# Patient Record
Sex: Male | Born: 1980 | Hispanic: Yes | Marital: Single | Smoking: Never smoker
Health system: Southern US, Community
[De-identification: ages and names within clinical notes are randomized; demographics above are authoritative.]

---

## 2019-07-14 ENCOUNTER — Emergency Department: Payer: Self-pay

## 2019-07-14 ENCOUNTER — Other Ambulatory Visit: Payer: Self-pay

## 2019-07-14 ENCOUNTER — Emergency Department
Admission: EM | Admit: 2019-07-14 | Discharge: 2019-07-14 | Disposition: A | Discharge status: Home / Self Care | Payer: Self-pay | Attending: Student | Admitting: Student

## 2019-07-14 DIAGNOSIS — Y9366 Activity, soccer: Secondary | ICD-10-CM | POA: Insufficient documentation

## 2019-07-14 DIAGNOSIS — S83005A Unspecified dislocation of left patella, initial encounter: Secondary | ICD-10-CM

## 2019-07-14 DIAGNOSIS — X501XXA Overexertion from prolonged static or awkward postures, initial encounter: Secondary | ICD-10-CM | POA: Insufficient documentation

## 2019-07-14 DIAGNOSIS — S83095A Other dislocation of left patella, initial encounter: Secondary | ICD-10-CM | POA: Insufficient documentation

## 2019-07-14 DIAGNOSIS — Y92322 Soccer field as the place of occurrence of the external cause: Secondary | ICD-10-CM | POA: Insufficient documentation

## 2019-07-14 DIAGNOSIS — Y999 Unspecified external cause status: Secondary | ICD-10-CM | POA: Insufficient documentation

## 2019-07-14 MED ORDER — CYCLOBENZAPRINE HCL 5 MG PO TABS
5.0000 mg | ORAL_TABLET | Freq: Three times a day (TID) | ORAL | 0 refills | Status: AC | PRN
Start: 2019-07-14 — End: ?

## 2019-07-14 MED ORDER — NAPROXEN 500 MG PO TABS: 500.0000 mg | ORAL_TABLET | Freq: Two times a day (BID) | ORAL | 0 refills | Status: AC

## 2019-07-14 MED ORDER — LORAZEPAM 2 MG/ML IJ SOLN
1.0000 mg | Freq: Once | INTRAMUSCULAR | Status: AC
  Administered 2019-07-14: 1 mg via INTRAVENOUS
  Filled 2019-07-14: qty 1

## 2019-07-14 NOTE — Discharge Instructions (Signed)
Please wear the knee immobilizer until follow up with orthopedics.  Take the medications as prescribed. Do not drive or use heavy equipment while taking the muscle relaxer.  Return to the ER for symptoms of concern if unable to see orthopedics.

## 2019-07-14 NOTE — ED Triage Notes (Signed)
Pt reports he was playing soccer and landed on left leg and knee gave out. Per ems, 100 fentanyl given.  Pt is AOX4, NAD noted. Left knee has obvious deformity and swelling noted. Pedal pulses 1+ bilaterally. Pt c/o decreased sensation in left leg. Movement hindered by pain.

## 2019-07-14 NOTE — ED Notes (Signed)
Pt educated on RICE therapy, medications, and brace care. Pt verbalizes understanding. CMS intact in lower extremities.

## 2019-07-14 NOTE — ED Provider Notes (Addendum)
[facility] Emergency Department Provider Note ____________________________________________  Time seen: Approximately 12:22 PM  I have reviewed the triage vital signs and the nursing notes.   HISTORY  Chief Complaint Knee Injury    HPI Jonathon Reilly is a 39 y.o. male who presents to the emergency department for evaluation and treatment of left knee pain. While playing soccer, he kicked the ball and knee "popped out." No previous knee surgery or dislocation. No other injury or pain. EMS gave 100 mcg of Fentanyl en route. Pain now 4/10.  History reviewed. No pertinent past medical history.  There are no problems to display for this patient.  Prior to Admission medications   Medication Sig Start Date End Date Taking? Authorizing Provider  cyclobenzaprine (FLEXERIL) 5 MG tablet Take 1 tablet (5 mg total) by mouth 3 (three) times daily as needed for muscle spasms. 07/14/19   Ashlyn Cabler, Johnette Abraham B, FNP  naproxen (NAPROSYN) 500 MG tablet Take 1 tablet (500 mg total) by mouth 2 (two) times daily with a meal. 07/14/19   Kalina Morabito B, FNP    Allergies Patient has no allergy information on record.  History reviewed. No pertinent family history.  Social History Social History   Tobacco Use  . Smoking status: Never Smoker  . Smokeless tobacco: Never Used  Substance Use Topics  . Alcohol use: Not on file  . Drug use: Not on file    Review of Systems Constitutional: Negative for fever. Cardiovascular: Negative for chest pain. Respiratory: Negative for shortness of breath. Musculoskeletal: Positive for obvious patellar dislocation Skin: No open wounds or lesions.  Neurological: Negative for decrease in sensation  ____________________________________________   PHYSICAL EXAM:  Today's Vitals   07/14/19 1300 07/14/19 1307 07/14/19 1328 07/14/19 1341  BP: 122/86     Pulse: 69  (!) 54   Resp: (!) 22  14   Temp:      TempSrc:      SpO2: 98%  100%    Weight:      Height:      PainSc:  0-No pain  0-No pain   Body mass index is 27.62 kg/m.   Constitutional: Alert and oriented. Well appearing and in no acute distress. Eyes: Conjunctivae are clear without discharge or drainage Head: Atraumatic Neck: Supple. No midline tenderness. Respiratory: No cough. Respirations are even and unlabored. Musculoskeletal: Positive for left knee pain. Neurologic: Motor function intact. Decreased sensation to left foot.  Skin: No open wounds or lesions.  Psychiatric: Affect and behavior are appropriate.  ____________________________________________   LABS (all labs ordered are listed, but only abnormal results are displayed)  Labs Reviewed - No data to display ____________________________________________  RADIOLOGY  Lateral patellar dislocation left knee with questionable tibial plateau avulsion fracture.  Post reduction image shows reduction of previous patella dislocation.  I, Sherrie George, personally viewed and evaluated these images (plain radiographs) as part of my medical decision making, as well as reviewing the written report by the radiologist.  DG Knee 2 Views Left  Result Date: 07/14/2019 CLINICAL DATA:  Post reduction left patella EXAM: LEFT KNEE - 1-2 VIEW COMPARISON:  None. FINDINGS: The patellar dislocation seen previously has been reduced. The patella otherwise appears intact. Probable small joint effusion. No fracture. IMPRESSION: Interval reduction of the previously identified patellar dislocation. Small effusion. No other abnormalities. Electronically Signed   By: Dorise Bullion III M.D   On: 07/14/2019 13:24   DG Knee 2 Views Left  Result Date: 07/14/2019 CLINICAL DATA:  Soccer injury.  Deformity of knee. EXAM: LEFT KNEE - 1-2 VIEW COMPARISON:  None. FINDINGS: The patella is dislocated laterally. The distal femur, proximal tibia, and proximal fibula appear to be intact. No joint effusion. IMPRESSION: Lateral dislocation  of the patella. No other bony abnormalities are noted. Electronically Signed   By: Gerome Sam III M.D   On: 07/14/2019 12:57   ____________________________________________   PROCEDURES  .Ortho Injury Treatment  Date/Time: 07/14/2019 12:26 PM Performed by: Chinita Pester, FNP Authorized by: Chinita Pester, FNP   Consent:    Consent obtained:  Verbal   Consent given by:  Patient   Risks discussed:  Recurrent dislocationInjury location: knee Location details: left knee Injury type: dislocation Dislocation type: lateral patellar Pre-procedure distal perfusion: normal Pre-procedure neurological function: normal Pre-procedure range of motion: reduced  Anesthesia: Local anesthesia used: no  Patient sedated: NoManipulation performed: yes Reduction method: direct traction Reduction successful: yes X-ray confirmed reduction: yes Immobilization: brace (knee immobilizer) Splint type: knee immobilizer. Post-procedure neurovascular assessment: post-procedure neurovascularly intact Post-procedure distal perfusion: normal Post-procedure neurological function: normal Post-procedure range of motion: normal Patient tolerance: patient tolerated the procedure well with no immediate complications     ____________________________________________   INITIAL IMPRESSION / ASSESSMENT AND PLAN / ED COURSE  Jonathon Reilly is a 39 y.o. who presents to the emergency department for treatment after dislocation of left patella while playing soccer about 1 hour prior to arrival.  X-ray shows lateral patella dislocation with questionable tibial plateau avulsion fracture.   Radiology reading does not include concern for tibial plateau avulsion.   See above for reduction procedure details. He will be placed in knee immobilizer and follow up with orthopedics.   Patient instructed to follow-up with orthopedics.  He was also instructed to return to the emergency department for symptoms that  change or worsen if unable schedule an appointment with orthopedics or primary care.  Medications  LORazepam (ATIVAN) injection 1 mg (1 mg Intravenous Given 07/14/19 1240)    Pertinent labs & imaging results that were available during my care of the patient were reviewed by me and considered in my medical decision making (see chart for details).   _________________________________________   FINAL CLINICAL IMPRESSION(S) / ED DIAGNOSES  Final diagnoses:  Patellar dislocation, left, initial encounter    ED Discharge Orders         Ordered    cyclobenzaprine (FLEXERIL) 5 MG tablet  3 times daily PRN     07/14/19 1323    naproxen (NAPROSYN) 500 MG tablet  2 times daily with meals     07/14/19 1323           If controlled substance prescribed during this visit, 12 month history viewed on the [facility] prior to issuing an initial prescription for Schedule II or III opiod.   Chinita Pester, FNP 07/14/19 1328    Chinita Pester, FNP 07/14/19 1512    Miguel Aschoff., MD 07/14/19 [phone]

## 2021-02-13 IMAGING — DX DG KNEE 1-2V*L*
2 series · 2 of 2 positions shown · non-contrast
Comparison: None.

CLINICAL DATA: Soccer injury.  Deformity of knee.

EXAM:
LEFT KNEE - 1-2 VIEW

[knee ap]
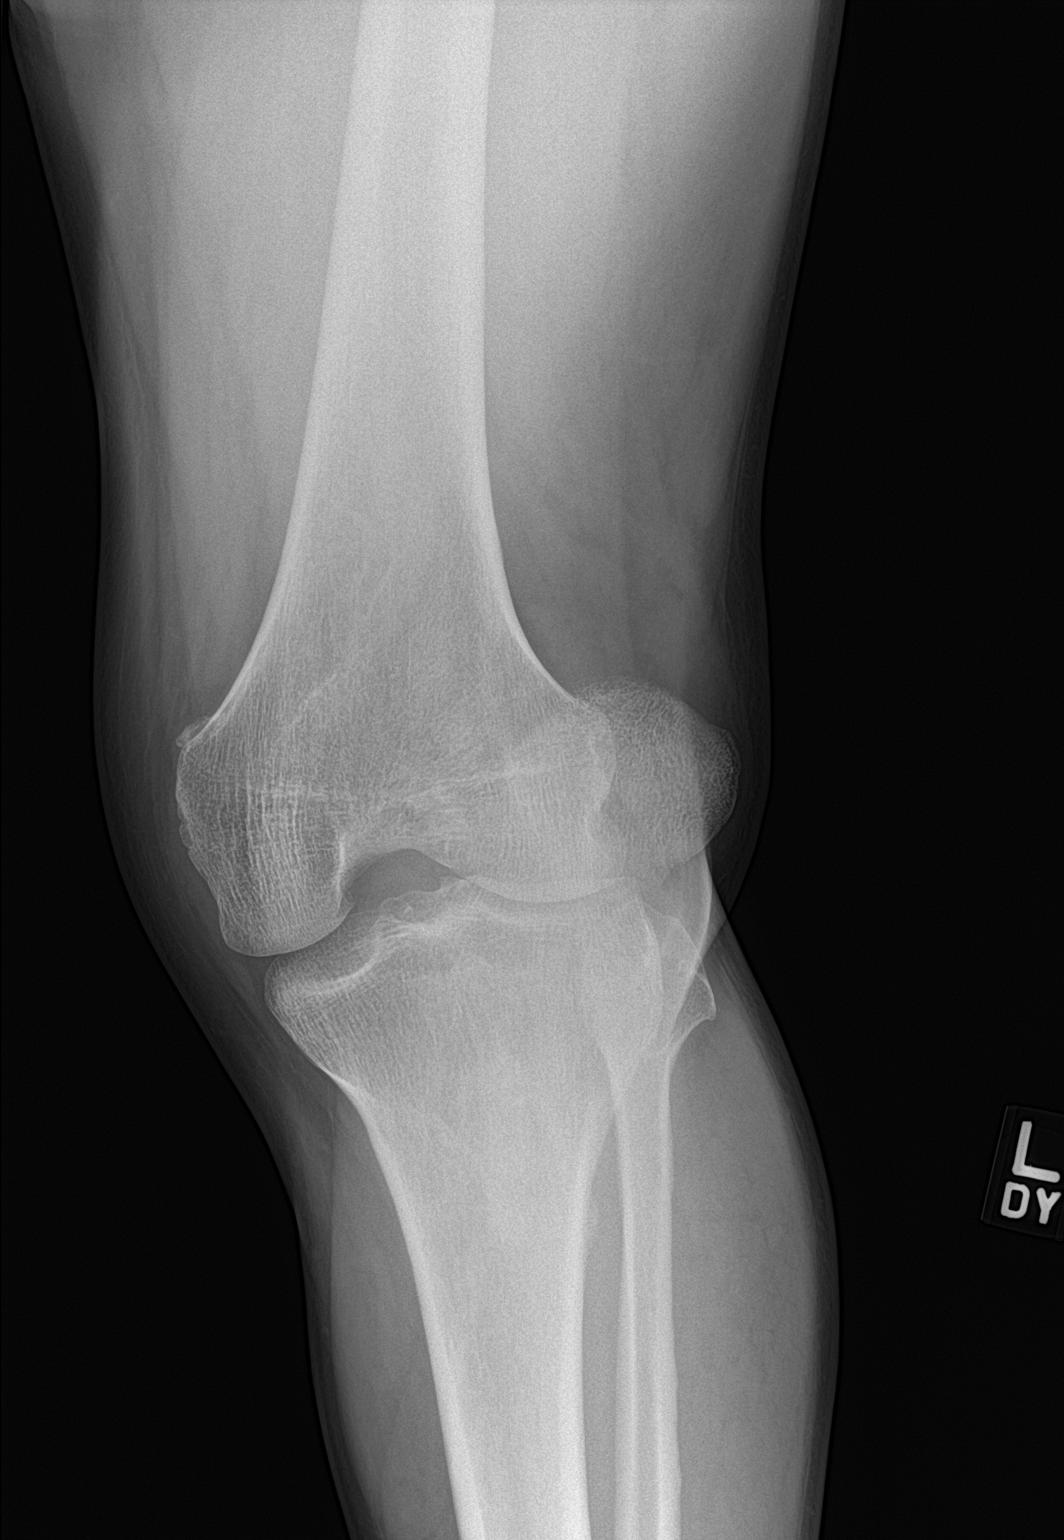

[knee lat]
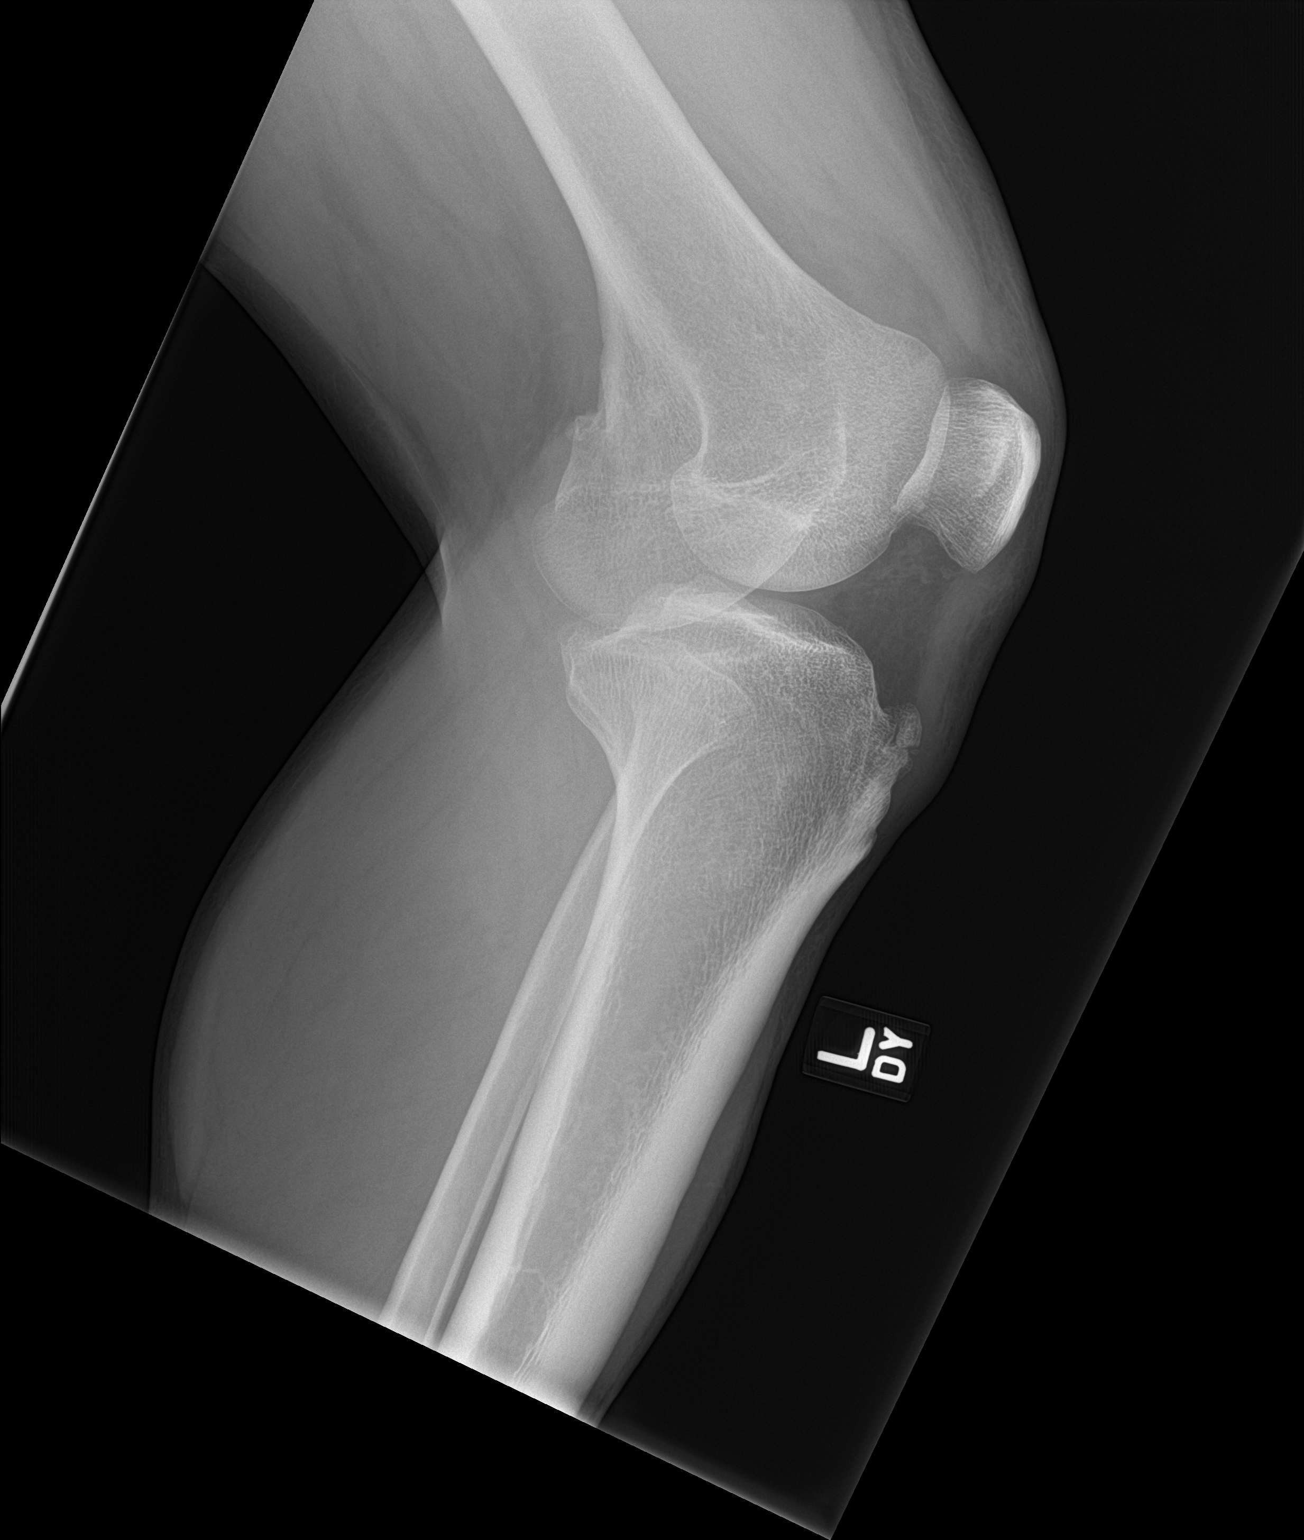

[2 of 2 positions shown; findings below may reference images not displayed]

FINDINGS: The patella is dislocated laterally. The distal femur, proximal
tibia, and proximal fibula appear to be intact. No joint effusion.
IMPRESSION: Lateral dislocation of the patella. No other bony abnormalities are
noted.

## 2021-09-19 ENCOUNTER — Emergency Department
Admission: EM | Admit: 2021-09-19 | Discharge: 2021-09-19 | Disposition: A | Discharge status: Home / Self Care | Payer: Self-pay | Attending: Emergency Medicine | Admitting: Emergency Medicine

## 2021-09-19 ENCOUNTER — Other Ambulatory Visit: Payer: Self-pay

## 2021-09-19 DIAGNOSIS — L509 Urticaria, unspecified: Secondary | ICD-10-CM | POA: Insufficient documentation

## 2021-09-19 MED ORDER — DIPHENHYDRAMINE HCL 25 MG PO CAPS
50.0000 mg | ORAL_CAPSULE | Freq: Once | ORAL | Status: AC
  Administered 2021-09-19: 50 mg via ORAL
  Filled 2021-09-19: qty 2

## 2021-09-19 MED ORDER — FAMOTIDINE 20 MG PO TABS: 20.0000 mg | ORAL_TABLET | Freq: Two times a day (BID) | ORAL | 0 refills | Status: AC

## 2021-09-19 MED ORDER — PREDNISONE 10 MG (21) PO TBPK: ORAL_TABLET | ORAL | 0 refills | Status: AC

## 2021-09-19 MED ORDER — PREDNISONE 20 MG PO TABS
60.0000 mg | ORAL_TABLET | Freq: Once | ORAL | Status: AC
  Administered 2021-09-19: 60 mg via ORAL
  Filled 2021-09-19: qty 3

## 2021-09-19 MED ORDER — FAMOTIDINE 20 MG PO TABS
20.0000 mg | ORAL_TABLET | Freq: Once | ORAL | Status: AC
  Administered 2021-09-19: 20 mg via ORAL
  Filled 2021-09-19: qty 1

## 2021-09-19 NOTE — ED Triage Notes (Signed)
Pt with hives noted to torso, back and legs since 1600 yesterday. Pt without angioedema. No resp distress noted. Pt denies nausea, vomiting.

## 2021-09-19 NOTE — ED Provider Notes (Signed)
[facility] Provider Note    Event Date/Time   First MD Initiated Contact with Patient 09/19/21 [phone]     (approximate)   History   Allergic Reaction   HPI  Jonathon Reilly is a 41 y.o. male with no significant past medical history who presents emergency department with diffuse hives mostly to his lower extremities that started today.  States he was out in the woods and afterwards felt slightly itchy and then later developed hives.  No other new exposures.  No shortness of breath, lip or tongue swelling.  No tick bite, no fever.   History provided by patient.    No past medical history on file.  No past surgical history on file.  MEDICATIONS:  Prior to Admission medications   Medication Sig Start Date End Date Taking? Authorizing Provider  cyclobenzaprine (FLEXERIL) 5 MG tablet Take 1 tablet (5 mg total) by mouth 3 (three) times daily as needed for muscle spasms. 07/14/19   Triplett, Rulon Eisenmenger B, FNP  naproxen (NAPROSYN) 500 MG tablet Take 1 tablet (500 mg total) by mouth 2 (two) times daily with a meal. 07/14/19   Chinita Pester, FNP    Physical Exam   Triage Vital Signs: ED Triage Vitals  Enc Vitals Group     BP 09/19/21 0351 113/62     Pulse Rate 09/19/21 0345 82     Resp 09/19/21 0345 18     Temp 09/19/21 0345 98.6 F (37 C)     Temp Source 09/19/21 0345 Oral     SpO2 09/19/21 0345 98 %     Weight 09/19/21 0345 215 lb (97.5 kg)     Height 09/19/21 0345 5\' 11"  (1.803 m)     Head Circumference --      Peak Flow --      Pain Score 09/19/21 0345 0     Pain Loc --      Pain Edu? --      Excl. in GC? --     Most recent vital signs: Vitals:   09/19/21 0345 09/19/21 0351  BP:  113/62  Pulse: 82   Resp: 18   Temp: 98.6 F (37 C)   SpO2: 98%     CONSTITUTIONAL: Alert and oriented and responds appropriately to questions. Well-appearing; well-nourished HEAD: Normocephalic, atraumatic EYES: Conjunctivae clear, pupils appear equal, sclera  nonicteric ENT: normal nose; moist mucous membranes, no angioedema, normal phonation, no trismus or drooling, no stridor NECK: Supple, normal ROM CARD: RRR; S1 and S2 appreciated; no murmurs, no clicks, no rubs, no gallops RESP: Normal chest excursion without splinting or tachypnea; breath sounds clear and equal bilaterally; no wheezes, no rhonchi, no rales, no hypoxia or respiratory distress, speaking full sentences ABD/GI: Normal bowel sounds; non-distended; soft, non-tender, no rebound, no guarding, no peritoneal signs BACK: The back appears normal EXT: Normal ROM in all joints; no deformity noted, no edema; no cyanosis SKIN: Normal color for age and race; warm; diffuse urticaria to the lower extremities mostly around the inner thighs NEURO: Moves all extremities equally, normal speech PSYCH: The patient's mood and manner are appropriate.   ED Results / Procedures / Treatments   LABS: (all labs ordered are listed, but only abnormal results are displayed) Labs Reviewed - No data to display   EKG:   RADIOLOGY: My personal review and interpretation of imaging:    I have personally reviewed all radiology reports.   No results found.   PROCEDURES:  Critical Care performed:  No     Procedures    IMPRESSION / MDM / ASSESSMENT AND PLAN / ED COURSE  I reviewed the triage vital signs and the nursing notes.    Patient here with urticaria likely exposed to something when he was out in the woods.  He denies any other new soaps, lotions, detergents, foods or medications.  Received Benadryl, Pepcid and prednisone while in triage and states he is already feeling better.  No airway involvement.     DIFFERENTIAL DIAGNOSIS (includes but not limited to):   Allergic reaction, no signs of anaphylaxis.  Doubt infectious etiology such as RMSF, cellulitis.  No signs of Stevens-Johnson.   Patient's presentation is most consistent with acute presentation with potential threat to life or  bodily function.   PLAN: Patient received medications in triage and is already feeling better.  No airway involvement or other systemic symptoms.  Will discharge home with prednisone taper, Pepcid and advised him to continue using over-the-counter Benadryl.  We will give him allergy follow-up as needed.   MEDICATIONS GIVEN IN ED: Medications  diphenhydrAMINE (BENADRYL) capsule 50 mg (50 mg Oral Given 09/19/21 0354)  predniSONE (DELTASONE) tablet 60 mg (60 mg Oral Given 09/19/21 0354)  famotidine (PEPCID) tablet 20 mg (20 mg Oral Given 09/19/21 0354)     ED COURSE:  At this time, I do not feel there is any life-threatening condition present. I reviewed all nursing notes, vitals, pertinent previous records.  All lab and urine results, EKGs, imaging ordered have been independently reviewed and interpreted by myself.  I reviewed all available radiology reports from any imaging ordered this visit.  Based on my assessment, I feel the patient is safe to be discharged home without further emergent workup and can continue workup as an outpatient as needed. Discussed all findings, treatment plan as well as usual and customary return precautions.  They verbalize understanding and are comfortable with this plan.  Outpatient follow-up has been provided as needed.  All questions have been answered.    CONSULTS: No admission required at this time.  Patient feeling better with [facility] only and no hypoxia, wheezing, hypotension, angioedema.   OUTSIDE RECORDS REVIEWED: Reviewed patient's last family medicine visit on 04/12/2018.       FINAL CLINICAL IMPRESSION(S) / ED DIAGNOSES   Final diagnoses:  Hives     Rx / DC Orders   ED Discharge Orders          Ordered    predniSONE (STERAPRED UNI-PAK 21 TAB) 10 MG (21) TBPK tablet        09/19/21 0432    famotidine (PEPCID) 20 MG tablet  2 times daily        09/19/21 4098             Note:  This document was prepared using Dragon voice  recognition software and may include unintentional dictation errors.   Camila Maita, Layla Maw, DO 09/19/21 [phone]

## 2021-09-19 NOTE — Discharge Instructions (Signed)
You may continue over-the-counter Benadryl 50 mg every 6 hours as needed for itching, rash.   Steps to find a Primary Care Provider (PCP):  Call [phone] or [phone] to access "[facility] Find a Doctor Service."  2.  You may also go on the [facility] website at [email or web address]

## 2023-02-06 ENCOUNTER — Emergency Department: Admission: EM | Admit: 2023-02-06 | Discharge: 2023-02-06 | Discharge status: Against Medical Advice | Payer: Self-pay

## 2023-02-06 NOTE — ED Notes (Signed)
No answer when called several times from lobby 

## 2023-02-06 NOTE — ED Notes (Signed)
PT to ED via [facility] from the store. Pt dropped a bottle and it hit his right lower leg and cut it.
